# Patient Record
Sex: Female | Born: 1970
Health system: Southern US, Community
[De-identification: ages and names within clinical notes are randomized; demographics above are authoritative.]

## PROBLEM LIST (undated history)

## (undated) DIAGNOSIS — F988 Other specified behavioral and emotional disorders with onset usually occurring in childhood and adolescence: Secondary | ICD-10-CM

## (undated) DIAGNOSIS — R4586 Emotional lability: Secondary | ICD-10-CM

## (undated) HISTORY — PX: BREAST ENHANCEMENT SURGERY: SHX7

## (undated) HISTORY — DX: Other specified behavioral and emotional disorders with onset usually occurring in childhood and adolescence: F98.8

## (undated) HISTORY — PX: TONSILLECTOMY: SUR1361

## (undated) HISTORY — PX: TUBAL LIGATION: SHX77

## (undated) HISTORY — DX: Emotional lability: R45.86

---

## 2009-08-30 ENCOUNTER — Ambulatory Visit (HOSPITAL_COMMUNITY): Admission: RE | Admit: 2009-08-30 | Discharge: 2009-08-30 | Payer: Self-pay | Admitting: Family Medicine

## 2012-02-12 ENCOUNTER — Other Ambulatory Visit (HOSPITAL_COMMUNITY): Payer: Self-pay | Admitting: Family Medicine

## 2012-02-12 DIAGNOSIS — Z0000 Encounter for general adult medical examination without abnormal findings: Secondary | ICD-10-CM

## 2012-02-12 DIAGNOSIS — Z1231 Encounter for screening mammogram for malignant neoplasm of breast: Secondary | ICD-10-CM

## 2012-05-06 ENCOUNTER — Ambulatory Visit (HOSPITAL_COMMUNITY): Admission: RE | Admit: 2012-05-06 | Payer: Self-pay | Source: Ambulatory Visit

## 2012-07-12 ENCOUNTER — Ambulatory Visit (HOSPITAL_COMMUNITY)
Admission: RE | Admit: 2012-07-12 | Discharge: 2012-07-12 | Disposition: A | Discharge status: Home / Self Care | Payer: 59 | Source: Ambulatory Visit | Attending: Family Medicine | Admitting: Family Medicine

## 2012-07-12 DIAGNOSIS — Z1231 Encounter for screening mammogram for malignant neoplasm of breast: Secondary | ICD-10-CM | POA: Insufficient documentation

## 2013-11-28 ENCOUNTER — Other Ambulatory Visit (HOSPITAL_COMMUNITY): Payer: Self-pay | Admitting: Family Medicine

## 2013-11-28 DIAGNOSIS — Z1231 Encounter for screening mammogram for malignant neoplasm of breast: Secondary | ICD-10-CM

## 2013-12-05 ENCOUNTER — Ambulatory Visit (HOSPITAL_COMMUNITY): Payer: 59 | Attending: Family Medicine

## 2014-12-27 ENCOUNTER — Other Ambulatory Visit: Payer: Self-pay | Admitting: Family Medicine

## 2014-12-27 DIAGNOSIS — Z1231 Encounter for screening mammogram for malignant neoplasm of breast: Secondary | ICD-10-CM

## 2014-12-28 ENCOUNTER — Ambulatory Visit (HOSPITAL_COMMUNITY)
Admission: RE | Admit: 2014-12-28 | Discharge: 2014-12-28 | Disposition: A | Discharge status: Home / Self Care | Payer: 59 | Source: Ambulatory Visit | Attending: Family Medicine | Admitting: Family Medicine

## 2014-12-28 DIAGNOSIS — Z1231 Encounter for screening mammogram for malignant neoplasm of breast: Secondary | ICD-10-CM | POA: Diagnosis present

## 2015-05-16 DIAGNOSIS — H5213 Myopia, bilateral: Secondary | ICD-10-CM | POA: Diagnosis not present

## 2016-05-26 ENCOUNTER — Other Ambulatory Visit: Payer: Self-pay | Admitting: Family Medicine

## 2016-05-26 DIAGNOSIS — Z1231 Encounter for screening mammogram for malignant neoplasm of breast: Secondary | ICD-10-CM

## 2016-06-19 ENCOUNTER — Ambulatory Visit: Payer: 59

## 2016-07-03 ENCOUNTER — Ambulatory Visit
Admission: RE | Admit: 2016-07-03 | Discharge: 2016-07-03 | Disposition: A | Discharge status: Home / Self Care | Payer: 59 | Source: Ambulatory Visit | Attending: Family Medicine | Admitting: Family Medicine

## 2016-07-03 DIAGNOSIS — Z1231 Encounter for screening mammogram for malignant neoplasm of breast: Secondary | ICD-10-CM | POA: Diagnosis not present

## 2017-04-06 ENCOUNTER — Ambulatory Visit (INDEPENDENT_AMBULATORY_CARE_PROVIDER_SITE_OTHER): Payer: 59 | Admitting: General Practice

## 2017-04-06 DIAGNOSIS — N898 Other specified noninflammatory disorders of vagina: Secondary | ICD-10-CM | POA: Diagnosis not present

## 2017-04-06 DIAGNOSIS — N949 Unspecified condition associated with female genital organs and menstrual cycle: Secondary | ICD-10-CM

## 2017-04-06 DIAGNOSIS — Z113 Encounter for screening for infections with a predominantly sexual mode of transmission: Secondary | ICD-10-CM

## 2017-04-06 NOTE — Progress Notes (Signed)
Patient here today for self swab. Patient reports vaginal burning & thick discharge. Patient instructed in self swab & specimen collected. Informed patient we will contact her with abnormal results. Patient verbalized understanding & had no questions

## 2017-04-08 ENCOUNTER — Other Ambulatory Visit: Payer: Self-pay | Admitting: General Practice

## 2017-04-08 ENCOUNTER — Encounter: Payer: Self-pay | Admitting: General Practice

## 2017-04-08 DIAGNOSIS — B9689 Other specified bacterial agents as the cause of diseases classified elsewhere: Secondary | ICD-10-CM

## 2017-04-08 DIAGNOSIS — N760 Acute vaginitis: Principal | ICD-10-CM

## 2017-04-08 LAB — CERVICOVAGINAL ANCILLARY ONLY
Bacterial vaginitis: POSITIVE — AB
CHLAMYDIA, DNA PROBE: NEGATIVE
Candida vaginitis: NEGATIVE
Neisseria Gonorrhea: NEGATIVE
Trichomonas: NEGATIVE

## 2017-04-08 MED ORDER — METRONIDAZOLE 500 MG PO TABS: 500.0000 mg | ORAL_TABLET | Freq: Two times a day (BID) | ORAL | 0 refills | Status: DC

## 2017-04-09 ENCOUNTER — Other Ambulatory Visit: Payer: Self-pay | Admitting: Family Medicine

## 2017-09-10 ENCOUNTER — Other Ambulatory Visit: Payer: Self-pay | Admitting: Advanced Practice Midwife

## 2017-09-10 DIAGNOSIS — Z1231 Encounter for screening mammogram for malignant neoplasm of breast: Secondary | ICD-10-CM

## 2017-11-03 ENCOUNTER — Other Ambulatory Visit: Payer: Self-pay | Admitting: Student

## 2017-11-03 MED ORDER — CIPROFLOXACIN HCL 500 MG PO TABS
500.0000 mg | ORAL_TABLET | Freq: Two times a day (BID) | ORAL | 0 refills | Status: DC
Start: 2017-11-03 — End: 2019-06-14

## 2017-11-09 ENCOUNTER — Ambulatory Visit (INDEPENDENT_AMBULATORY_CARE_PROVIDER_SITE_OTHER): Payer: 59 | Admitting: Advanced Practice Midwife

## 2017-11-09 ENCOUNTER — Encounter: Payer: Self-pay | Admitting: Advanced Practice Midwife

## 2017-11-09 ENCOUNTER — Other Ambulatory Visit (HOSPITAL_COMMUNITY)
Admission: RE | Admit: 2017-11-09 | Discharge: 2017-11-09 | Disposition: A | Discharge status: Home / Self Care | Payer: 59 | Source: Ambulatory Visit | Attending: Advanced Practice Midwife | Admitting: Advanced Practice Midwife

## 2017-11-09 VITALS — BP 111/77 | HR 72 | Resp 16 | Ht 64.0 in | Wt 136.0 lb

## 2017-11-09 DIAGNOSIS — Z01419 Encounter for gynecological examination (general) (routine) without abnormal findings: Secondary | ICD-10-CM | POA: Insufficient documentation

## 2017-11-09 DIAGNOSIS — F419 Anxiety disorder, unspecified: Secondary | ICD-10-CM

## 2017-11-09 DIAGNOSIS — N3000 Acute cystitis without hematuria: Secondary | ICD-10-CM

## 2017-11-09 DIAGNOSIS — N898 Other specified noninflammatory disorders of vagina: Secondary | ICD-10-CM

## 2017-11-09 MED ORDER — METRONIDAZOLE 500 MG PO TABS: 500.0000 mg | ORAL_TABLET | Freq: Two times a day (BID) | ORAL | 2 refills | Status: AC

## 2017-11-09 MED ORDER — BUPROPION HCL ER (XL) 300 MG PO TB24: 300.0000 mg | ORAL_TABLET | Freq: Every day | ORAL | 11 refills | Status: DC

## 2017-11-09 MED ORDER — SULFAMETHOXAZOLE-TRIMETHOPRIM 800-160 MG PO TABS: 1.0000 | ORAL_TABLET | Freq: Two times a day (BID) | ORAL | 0 refills | Status: AC

## 2017-11-09 NOTE — Progress Notes (Signed)
Subjective:     Karen Cohen is a 47 y.o. female here for a routine exam.  Current complaints: vaginal discharge with odor and urine with odor.  She denies any abdominal or pelvic pain. She was treated for UTI 1 week ago with Cipro x 3 days but symptoms did not improve.  She is sexually active and is postmenopausal with LMP 2+ years ago.   She reports she was on Wellbutrin in the past and it worked well for her. She has not taken it for a long time but is not sleeping well and would like to try it again. She reports she is doing well, has support at home, and denies any thoughts of self harm.  Pt has primary care provider and participates in routine preventive care.  She has family hx of breast ca.  She scheduled her mammogram for this week, records to be sent to our office.  Pt had breast augmentation surgery since her last mammogram, 03/2017.     Gynecologic History No LMP recorded. (Menstrual status: Perimenopausal). Contraception: none Last Pap: unsure but within 5 years. Results were: normal Last mammogram: 2018. Results were: normal  Obstetric History OB History  Gravida Para Term Preterm AB Living  5 3 1 2 2     SAB TAB Ectopic Multiple Live Births  2            # Outcome Date GA Lbr Len/2nd Weight Sex Delivery Anes PTL Lv  5 SAB           4 SAB           3 Term      Vag-Spont     2 Preterm      Vag-Spont     1 Preterm      Vag-Spont        The following portions of the patient's history were reviewed and updated as appropriate: allergies, current medications, past family history, past medical history, past social history, past surgical history and problem list.  Review of Systems Pertinent items noted in HPI and remainder of comprehensive ROS otherwise negative.    Objective:   BP 111/77   Pulse 72   Resp 16   Ht 5\' 4"  (1.626 m)   Wt 136 lb (61.7 kg)   BMI 23.34 kg/m    VS reviewed, nursing note reviewed,  Constitutional: well developed, well nourished, no  distress HEENT: normocephalic CV: normal rate HEART: normal rate, heart sounds, regular rhythm RESP: normal effort, lung sounds clear and equal bilaterally Breast Exam:  right breast normal without mass, skin or nipple changes or axillary nodes, left breast normal without mass, skin or nipple changes or axillary nodes Abdomen: soft Neuro: alert and oriented x 3 Skin: warm, dry Psych: affect normal Pelvic exam: Cervix pink, visually closed, without lesion, small amount thin white discharge, vaginal walls and external genitalia normal Bimanual exam: Cervix 0/long/high, firm, anterior, neg CMT, uterus nontender, nonenlarged, adnexa without tenderness, enlargement, or mass  Assessment/Plan:   1. Well woman exam  - Culture, OB Urine - Cytology - PAP  2. Vaginal discharge --Clinical evidence and pt symptoms similar to past BV so will start treatment today. - metroNIDAZOLE (FLAGYL) 500 MG tablet; Take 1 tablet (500 mg total) by mouth 2 (two) times daily for 7 days.  Dispense: 14 tablet; Refill: 2  3. Acute cystitis without hematuria --Pt with failed treatment with Cipro --Urine culture pending but will change therapy to Bactrim - sulfamethoxazole-trimethoprim (BACTRIM DS,SEPTRA DS)  800-160 MG tablet; Take 1 tablet by mouth 2 (two) times daily for 3 days.  Dispense: 6 tablet; Refill: 0  4. Anxiety --Mild anxiety, well managed in past with Wellbutrin.  Start with 150 daily, then increase to 300 daily in 6 days (1/2 tablets for 6 days). - buPROPion (WELLBUTRIN XL) 300 MG 24 hr tablet; Take 1 tablet (300 mg total) by mouth daily. Take 1/2 tablet x 6 days to start, then 300 daily  Dispense: 30 tablet; Refill: 11  Yaritsa Savarino Leftwich-Kirby, CNM 1:25 PM

## 2017-11-09 NOTE — Patient Instructions (Signed)

## 2017-11-11 LAB — CYTOLOGY - PAP
BACTERIAL VAGINITIS: POSITIVE — AB
CHLAMYDIA, DNA PROBE: NEGATIVE
Candida vaginitis: NEGATIVE
DIAGNOSIS: UNDETERMINED — AB
HPV: NOT DETECTED
NEISSERIA GONORRHEA: NEGATIVE
Trichomonas: NEGATIVE

## 2017-11-13 ENCOUNTER — Ambulatory Visit
Admission: RE | Admit: 2017-11-13 | Discharge: 2017-11-13 | Disposition: A | Discharge status: Home / Self Care | Payer: 59 | Source: Ambulatory Visit | Attending: Advanced Practice Midwife | Admitting: Advanced Practice Midwife

## 2017-11-13 DIAGNOSIS — Z1231 Encounter for screening mammogram for malignant neoplasm of breast: Secondary | ICD-10-CM

## 2017-11-13 LAB — CULTURE, OB URINE

## 2017-11-13 LAB — URINE CULTURE, OB REFLEX

## 2017-11-17 ENCOUNTER — Other Ambulatory Visit: Payer: Self-pay | Admitting: Student

## 2018-03-03 ENCOUNTER — Telehealth: Payer: Self-pay | Admitting: Advanced Practice Midwife

## 2018-03-03 NOTE — Telephone Encounter (Signed)
Pt reports problems sleeping recently. Feels like mind is racing, too much to think about so can't go to sleep.  Has tried melatonin 3 mg that doesn't help, Benadryl 25 mg, which helps but makes her groggy the next day, and Ambien but it also makes her too groggy the next day.  Discussed options of increasing pt Wellbutrin (she is on 300 mg XL daily), trying smaller doses of Benadryl by cutting tablets or using liquid, or trying higher doses of melatonin.  Pt would like to try lower dose of Benadryl and/or increased melatonin for now and leave Wellbutrin at 300 mg daily. Discussed sleep hygiene prior to bedtime, including no screen time, relaxation exercises.  Pt to notify provider if sleep continues to be a problem.

## 2018-03-12 ENCOUNTER — Telehealth: Payer: 59 | Admitting: Family

## 2018-03-12 DIAGNOSIS — H5789 Other specified disorders of eye and adnexa: Secondary | ICD-10-CM | POA: Diagnosis not present

## 2018-03-12 NOTE — Progress Notes (Signed)
Thank you for the details you included in the comment boxes. Those details are very helpful in determining the best course of treatment for you and help [place] to provide the best care.Without specific drainage, this is more likely an allergic irritation and can improve with the treatment below, but would not require antibiotics. See plan below.  We are sorry that you are not feeling well.  Here is how we plan to help!  Based on what you have shared with me it looks like you have conjunctivitis.  Conjunctivitis is a common inflammatory or infectious condition of the eye that is often referred to as "pink eye".  In most cases it is contagious (viral or bacterial). However, not all conjunctivitis requires antibiotics (ex. Allergic).  We have made appropriate suggestions for you based upon your presentation.  I recommend that you use OpconA, 1-2 drops every 4-6 hours (an over the counter allergy drop available at your local pharmacy).  Your pharmacist may have an alternative suggestion.  Pink eye can be highly contagious.  It is typically spread through direct contact with secretions, or contaminated objects or surfaces that one may have touched.  Strict handwashing is suggested with soap and water is urged.  If not available, use alcohol based had sanitizer.  Avoid unnecessary touching of the eye.  If you wear contact lenses, you will need to refrain from wearing them until you see no white discharge from the eye for at least 24 hours after being on medication.  You should see symptom improvement in 1-2 days after starting the medication regimen.  Call us if symptoms are not improved in 1-2 days.  Home Care:  Wash your hands often!  Do not wear your contacts until you complete your treatment plan.  Avoid sharing towels, bed linen, personal items with a person who has pink eye.  See attention for anyone in your home with similar symptoms.  Get Help Right Away If:  Your symptoms do not improve.  You  develop blurred or loss of vision.  Your symptoms worsen (increased discharge, pain or redness)  Your e-visit answers were reviewed by a board certified advanced clinical practitioner to complete your personal care plan.  Depending on the condition, your plan could have included both over the counter or prescription medications.  If there is a problem please reply  once you have received a response from your provider.  Your safety is important to [place].  If you have drug allergies check your prescription carefully.    You can use MyChart to ask questions about today's visit, request a non-urgent call back, or ask for a work or school excuse for 24 hours related to this e-Visit. If it has been greater than 24 hours you will need to follow up with your provider, or enter a new e-Visit to address those concerns.   You will get an e-mail in the next two days asking about your experience.  I hope that your e-visit has been valuable and will speed your recovery. Thank you for using e-visits.

## 2018-04-20 DIAGNOSIS — N390 Urinary tract infection, site not specified: Secondary | ICD-10-CM | POA: Diagnosis not present

## 2018-04-20 DIAGNOSIS — Z113 Encounter for screening for infections with a predominantly sexual mode of transmission: Secondary | ICD-10-CM | POA: Diagnosis not present

## 2018-05-06 ENCOUNTER — Other Ambulatory Visit: Payer: Self-pay | Admitting: Advanced Practice Midwife

## 2018-05-06 DIAGNOSIS — F419 Anxiety disorder, unspecified: Secondary | ICD-10-CM

## 2018-05-06 MED ORDER — BUPROPION HCL ER (XL) 300 MG PO TB24
300.0000 mg | ORAL_TABLET | Freq: Every day | ORAL | 11 refills | Status: DC
Start: 2018-05-06 — End: 2018-09-10

## 2018-05-06 MED FILL — buPROPion HCL ER (XL) 300 M: 300 | 30 days supply | Qty: 30 | Fill #0

## 2018-05-06 NOTE — Progress Notes (Signed)
New Rx for Wellbutrin sent to [facility] due to pt insurance.

## 2018-06-16 MED FILL — buPROPion HCL ER (XL) 300 M: 300 | 30 days supply | Qty: 30 | Fill #1

## 2018-07-20 MED FILL — buPROPion HCL ER (XL) 300 M: 300 | 30 days supply | Qty: 30 | Fill #2

## 2018-09-10 ENCOUNTER — Telehealth: Payer: Self-pay | Admitting: Advanced Practice Midwife

## 2018-09-10 DIAGNOSIS — F419 Anxiety disorder, unspecified: Secondary | ICD-10-CM

## 2018-09-10 MED ORDER — BUPROPION HCL ER (XL) 300 MG PO TB24
300.0000 mg | ORAL_TABLET | Freq: Every day | ORAL | 4 refills | Status: DC
Start: 2018-09-10 — End: 2019-02-04

## 2018-09-10 MED FILL — buPROPion HCL ER (XL) 300 M: 300 | 90 days supply | Qty: 90 | Fill #0

## 2018-09-10 NOTE — Telephone Encounter (Signed)
Pt called to request Wellbutrin Rx in 90 day supply for mail order since pharmacy is closed due to COVID 19.  Rx written for 90 tablets with 4 refills.

## 2019-02-04 ENCOUNTER — Other Ambulatory Visit: Payer: Self-pay | Admitting: Advanced Practice Midwife

## 2019-02-04 DIAGNOSIS — F419 Anxiety disorder, unspecified: Secondary | ICD-10-CM

## 2019-02-04 MED ORDER — BUPROPION HCL ER (XL) 300 MG PO TB24: 300.0000 mg | ORAL_TABLET | Freq: Every day | ORAL | 4 refills | Status: DC

## 2019-02-04 NOTE — Progress Notes (Signed)
Refill for pt Wellbutrin 300 mg daily sent at pt request.

## 2019-02-08 DIAGNOSIS — Z0000 Encounter for general adult medical examination without abnormal findings: Secondary | ICD-10-CM | POA: Diagnosis not present

## 2019-02-08 DIAGNOSIS — F900 Attention-deficit hyperactivity disorder, predominantly inattentive type: Secondary | ICD-10-CM | POA: Diagnosis not present

## 2019-02-08 MED FILL — ADDERALL XR 20 MG CAP SA: 20 | 30 days supply | Qty: 30 | Fill #0

## 2019-02-09 ENCOUNTER — Other Ambulatory Visit: Payer: Self-pay | Admitting: Advanced Practice Midwife

## 2019-02-09 DIAGNOSIS — F419 Anxiety disorder, unspecified: Secondary | ICD-10-CM

## 2019-02-09 MED ORDER — BUPROPION HCL ER (XL) 300 MG PO TB24
300.0000 mg | ORAL_TABLET | Freq: Every day | ORAL | 4 refills | Status: AC
Start: 2019-02-09 — End: ?

## 2019-02-09 MED FILL — buPROPion HCL ER (XL) 300 M: 300 | 90 days supply | Qty: 90 | Fill #0

## 2019-02-09 NOTE — Progress Notes (Signed)
Rx for Wellbutrin changed from [facility] to [facility] at pt request.

## 2019-03-16 MED FILL — ADDERALL XR 20 MG CAP SA: 20 | 30 days supply | Qty: 30 | Fill #0

## 2019-05-11 DIAGNOSIS — F900 Attention-deficit hyperactivity disorder, predominantly inattentive type: Secondary | ICD-10-CM | POA: Diagnosis not present

## 2019-05-11 DIAGNOSIS — F419 Anxiety disorder, unspecified: Secondary | ICD-10-CM | POA: Diagnosis not present

## 2019-05-11 DIAGNOSIS — Z209 Contact with and (suspected) exposure to unspecified communicable disease: Secondary | ICD-10-CM | POA: Diagnosis not present

## 2019-05-23 MED FILL — BUPROPION HCL ER (XL) 300 M: 300 | 90 days supply | Qty: 90 | Fill #1

## 2019-06-14 ENCOUNTER — Other Ambulatory Visit: Payer: Self-pay

## 2019-06-14 ENCOUNTER — Encounter: Payer: Self-pay | Admitting: Advanced Practice Midwife

## 2019-06-14 ENCOUNTER — Ambulatory Visit (INDEPENDENT_AMBULATORY_CARE_PROVIDER_SITE_OTHER): Payer: 59 | Admitting: Advanced Practice Midwife

## 2019-06-14 VITALS — BP 120/76 | HR 73 | Temp 98.3°F | Resp 16 | Ht 64.0 in | Wt 132.0 lb

## 2019-06-14 DIAGNOSIS — Z1151 Encounter for screening for human papillomavirus (HPV): Secondary | ICD-10-CM | POA: Diagnosis not present

## 2019-06-14 DIAGNOSIS — Z01419 Encounter for gynecological examination (general) (routine) without abnormal findings: Secondary | ICD-10-CM

## 2019-06-14 DIAGNOSIS — F419 Anxiety disorder, unspecified: Secondary | ICD-10-CM | POA: Diagnosis not present

## 2019-06-14 DIAGNOSIS — Z124 Encounter for screening for malignant neoplasm of cervix: Secondary | ICD-10-CM

## 2019-06-14 DIAGNOSIS — Z634 Disappearance and death of family member: Secondary | ICD-10-CM | POA: Diagnosis not present

## 2019-06-14 NOTE — Progress Notes (Signed)
Subjective:     Karen Cohen is a 49 y.o. female here at [facility] for a routine exam.  Current complaints: no gyn concerns or complaints, LMP 6 years ago at age 38. Is in a monogamous relationship. Recently lost her father to COVID/pneumonia and is dealing with taking care of his house and things.  She reports good family support during a difficult time.     Gynecologic History No LMP recorded. (Menstrual status: Perimenopausal). Contraception: none Last Pap:11/09/2017. Results were: abnormal ASCUS with negative hpv Last mammogram: 11/13/2017. Results were: normal  Obstetric History OB History  Gravida Para Term Preterm AB Living  5 3 1 2 2     SAB TAB Ectopic Multiple Live Births  2            # Outcome Date GA Lbr Len/2nd Weight Sex Delivery Anes PTL Lv  5 SAB           4 SAB           3 Term      Vag-Spont     2 Preterm      Vag-Spont     1 Preterm      Vag-Spont        The following portions of the patient's history were reviewed and updated as appropriate: allergies, current medications, past family history, past medical history, past social history, past surgical history and problem list.  Review of Systems Pertinent items noted in HPI and remainder of comprehensive ROS otherwise negative.    Objective:   BP 120/76   Pulse 73   Temp 98.3 F (36.8 C)   Resp 16   Ht 5\' 4"  (1.626 m)   Wt 132 lb (59.9 kg)   BMI 22.66 kg/m    VS reviewed, nursing note reviewed,  Constitutional: well developed, well nourished, no distress HEENT: normocephalic CV: normal rate Pulm/chest wall: normal effort Breast Exam: Deferred, shared decision making with pt and screening mammogram ordered Abdomen: soft Neuro: alert and oriented x 3 Skin: warm, dry Psych: affect normal Pelvic exam: Cervix pink, visually closed, without lesion, scant white creamy discharge, vaginal walls and external genitalia normal Bimanual exam: Cervix 0/long/high, firm, anterior, neg CMT, uterus  nontender, nonenlarged, adnexa without tenderness, enlargement, or mass     Assessment/Plan:   1. Well woman exam with routine gynecological exam  - Cytology - PAP( [place])  2. Anxiety --Stable on Wellbutrin  3. Expected bereavement due to life event --Pt lost her father this week.  She is doing well despite difficulty. She has good support.     Follow up in: 1 year or as needed.   , CNM 12:19 PM

## 2019-06-14 NOTE — Progress Notes (Signed)
Thin prep collected today 

## 2019-06-15 DIAGNOSIS — F419 Anxiety disorder, unspecified: Secondary | ICD-10-CM | POA: Insufficient documentation

## 2019-06-15 LAB — CYTOLOGY - PAP
Comment: NEGATIVE
Diagnosis: NEGATIVE
High risk HPV: POSITIVE — AB

## 2019-06-24 ENCOUNTER — Other Ambulatory Visit: Payer: Self-pay | Admitting: Advanced Practice Midwife

## 2019-06-24 DIAGNOSIS — Z1231 Encounter for screening mammogram for malignant neoplasm of breast: Secondary | ICD-10-CM

## 2019-06-29 ENCOUNTER — Encounter: Payer: Self-pay | Admitting: Advanced Practice Midwife

## 2019-06-29 DIAGNOSIS — R87810 Cervical high risk human papillomavirus (HPV) DNA test positive: Secondary | ICD-10-CM | POA: Insufficient documentation

## 2019-07-01 ENCOUNTER — Ambulatory Visit
Admission: RE | Admit: 2019-07-01 | Discharge: 2019-07-01 | Disposition: A | Discharge status: Home / Self Care | Payer: 59 | Source: Ambulatory Visit

## 2019-07-01 ENCOUNTER — Other Ambulatory Visit: Payer: Self-pay

## 2019-07-01 DIAGNOSIS — Z1231 Encounter for screening mammogram for malignant neoplasm of breast: Secondary | ICD-10-CM

## 2019-08-09 DIAGNOSIS — F900 Attention-deficit hyperactivity disorder, predominantly inattentive type: Secondary | ICD-10-CM | POA: Diagnosis not present

## 2019-08-09 DIAGNOSIS — F419 Anxiety disorder, unspecified: Secondary | ICD-10-CM | POA: Diagnosis not present

## 2019-08-09 DIAGNOSIS — F4320 Adjustment disorder, unspecified: Secondary | ICD-10-CM | POA: Diagnosis not present

## 2019-09-30 ENCOUNTER — Other Ambulatory Visit: Payer: Self-pay | Admitting: Advanced Practice Midwife

## 2019-09-30 ENCOUNTER — Telehealth: Payer: Self-pay | Admitting: Advanced Practice Midwife

## 2019-09-30 DIAGNOSIS — L03113 Cellulitis of right upper limb: Secondary | ICD-10-CM

## 2019-09-30 MED ORDER — SULFAMETHOXAZOLE-TRIMETHOPRIM 400-80 MG PO TABS: 1.0000 | ORAL_TABLET | Freq: Two times a day (BID) | ORAL | 0 refills | Status: DC

## 2019-09-30 MED ORDER — FLUCONAZOLE 150 MG PO TABS: 150.0000 mg | ORAL_TABLET | Freq: Once | ORAL | 1 refills | Status: AC

## 2019-09-30 MED ORDER — SULFAMETHOXAZOLE-TRIMETHOPRIM 400-80 MG PO TABS: 1.0000 | ORAL_TABLET | Freq: Two times a day (BID) | ORAL | 0 refills | Status: AC

## 2019-09-30 NOTE — Telephone Encounter (Signed)
Pt called to report infected tattoo with pain and redness at site. She got a new tattoo on 09/23/19 and 3 days later began having pain and redness. The pain and redness is persistent but not worsening, and there is no drainage and no fever/chills or other systemic symptoms.    Rx for Bactrim BID x 10 days sent to pharmacy, [facility] in [place], [place]. Diflucan 150 mg x 1 with 1 refill sent at pt request.    Pt to follow up with PCP and/or dermatology if symptoms not improved in 48 hours and to seek emergency care if any systemic symptoms develop.

## 2019-10-21 ENCOUNTER — Other Ambulatory Visit: Payer: Self-pay | Admitting: Advanced Practice Midwife

## 2019-10-21 MED ORDER — AMPHETAMINE-DEXTROAMPHET ER 20 MG PO CP24: 20.0000 mg | ORAL_CAPSULE | Freq: Every day | ORAL | 0 refills | Status: AC

## 2019-11-08 DIAGNOSIS — F419 Anxiety disorder, unspecified: Secondary | ICD-10-CM | POA: Diagnosis not present

## 2019-11-08 DIAGNOSIS — F900 Attention-deficit hyperactivity disorder, predominantly inattentive type: Secondary | ICD-10-CM | POA: Diagnosis not present

## 2019-11-08 DIAGNOSIS — B002 Herpesviral gingivostomatitis and pharyngotonsillitis: Secondary | ICD-10-CM | POA: Diagnosis not present

## 2020-02-08 DIAGNOSIS — Z23 Encounter for immunization: Secondary | ICD-10-CM | POA: Diagnosis not present

## 2020-02-08 DIAGNOSIS — F419 Anxiety disorder, unspecified: Secondary | ICD-10-CM | POA: Diagnosis not present

## 2020-02-08 DIAGNOSIS — F900 Attention-deficit hyperactivity disorder, predominantly inattentive type: Secondary | ICD-10-CM | POA: Diagnosis not present

## 2020-07-31 ENCOUNTER — Other Ambulatory Visit: Payer: Self-pay | Admitting: Advanced Practice Midwife

## 2020-07-31 DIAGNOSIS — Z1231 Encounter for screening mammogram for malignant neoplasm of breast: Secondary | ICD-10-CM

## 2020-08-02 ENCOUNTER — Other Ambulatory Visit: Payer: Self-pay

## 2020-08-02 ENCOUNTER — Ambulatory Visit
Admission: RE | Admit: 2020-08-02 | Discharge: 2020-08-02 | Disposition: A | Discharge status: Home / Self Care | Payer: 59 | Source: Ambulatory Visit

## 2020-08-02 DIAGNOSIS — Z1231 Encounter for screening mammogram for malignant neoplasm of breast: Secondary | ICD-10-CM

## 2020-08-21 ENCOUNTER — Other Ambulatory Visit (HOSPITAL_COMMUNITY)
Admission: RE | Admit: 2020-08-21 | Discharge: 2020-08-21 | Disposition: A | Discharge status: Home / Self Care | Payer: 59 | Source: Ambulatory Visit | Attending: Certified Nurse Midwife | Admitting: Certified Nurse Midwife

## 2020-08-21 ENCOUNTER — Ambulatory Visit (INDEPENDENT_AMBULATORY_CARE_PROVIDER_SITE_OTHER): Payer: 59 | Admitting: Certified Nurse Midwife

## 2020-08-21 ENCOUNTER — Encounter: Payer: Self-pay | Admitting: Certified Nurse Midwife

## 2020-08-21 ENCOUNTER — Other Ambulatory Visit: Payer: Self-pay

## 2020-08-21 VITALS — BP 120/80 | HR 86 | Resp 16 | Ht 64.0 in | Wt 128.0 lb

## 2020-08-21 DIAGNOSIS — Z01419 Encounter for gynecological examination (general) (routine) without abnormal findings: Secondary | ICD-10-CM | POA: Insufficient documentation

## 2020-08-21 NOTE — Progress Notes (Signed)
Gynecology Annual Exam   History of Present Illness: Karen Cohen is a 50 y.o. single female presenting for an annual exam. She has no complaints today. She is not sexually active. She does perform self breast exams. There is notable family history of breast or ovarian cancer in her family.   Past Medical History:  Past Medical History:  Diagnosis Date  . ADD (attention deficit disorder)   . Mood swings     Past Surgical History:  Past Surgical History:  Procedure Laterality Date  . BREAST ENHANCEMENT SURGERY    . TONSILLECTOMY    . TUBAL LIGATION      Gynecologic History:  LMP: No LMP recorded. (Menstrual status: Perimenopausal). Contraception: tubal ligation Last Pap: completed on 11/2019; result was: NIL and HR HPV+  Mammogram: last completed on 08/02/20, result was normal.  Obstetric History: [id number]  Family History:  Family History  Problem Relation Age of Onset  . Breast cancer Mother 53    Social History:  Social History   Socioeconomic History  . Marital status: Single    Spouse name: Not on file  . Number of children: Not on file  . Years of education: Not on file  . Highest education level: Not on file  Occupational History  . Occupation: Charity fundraiser  Tobacco Use  . Smoking status: Never Smoker  . Smokeless tobacco: Never Used  Vaping Use  . Vaping Use: Never used  Substance and Sexual Activity  . Alcohol use: Yes    Comment: socially  . Drug use: Never  . Sexual activity: Yes    Partners: Male    Birth control/protection: None  Other Topics Concern  . Not on file  Social History Narrative  . Not on file   Social Determinants of Health   Financial Resource Strain: Not on file  Food Insecurity: Not on file  Transportation Needs: Not on file  Physical Activity: Not on file  Stress: Not on file  Social Connections: Not on file  Intimate Partner Violence: Not on file    Allergies:  No Known Allergies  Medications: Prior to Admission  medications   Medication Sig Start Date End Date Taking? Authorizing Provider  amphetamine-dextroamphetamine (ADDERALL XR) 20 MG 24 hr capsule Take 1 capsule (20 mg total) by mouth daily. 10/21/19  Yes Leftwich-Kirby, Wilmer Floor, CNM  buPROPion (WELLBUTRIN XL) 300 MG 24 hr tablet Take 1 tablet (300 mg total) by mouth daily. 02/09/19  Yes Leftwich-Kirby, Wilmer Floor, CNM    Review of Systems: negative except noted in HPI  Physical Exam Vitals: BP 120/80   Pulse 86   Resp 16   Ht 5\' 4"  (1.626 m)   Wt 128 lb (58.1 kg)   BMI 21.97 kg/m  General: NAD HEENT: normocephalic, atraumatic Thyroid: no enlargement, no palpable nodules Pulmonary: Normal rate and effort, CTAB Cardiovascular: RRR Breast: Breast symmetrical, no tenderness, no palpable nodules or masses, no skin or nipple retraction present, no nipple discharge. No axillary or supraclavicular lymphadenopathy. Abdomen: soft, non-tender, non-distended. No hepatomegaly, splenomegaly or masses palpable. No evidence of hernia  Genitourinary:  External: Normal external female genitalia. Normal urethral meatus  Vagina: Normal vaginal mucosa, no evidence of prolapse   Cervix: Grossly normal in appearance, no bleeding   Extremities: no edema, erythema, or tenderness Neurologic: Grossly intact Psychiatric: mood appropriate, affect full  Female chaperone present for pelvic and breast portions of the physical exam  Assessment:  1. Well woman exam with routine gynecological exam  Plan: Repeat pap today Follow up with GYN in 1 year or prn Follow up with PCP as scheduled  Donette Larry, CNM 08/21/2020 11:59 AM

## 2020-08-24 LAB — CYTOLOGY - PAP
Comment: NEGATIVE
Diagnosis: NEGATIVE
High risk HPV: NEGATIVE

## 2021-09-27 IMAGING — MG DIGITAL SCREENING BREAST BILAT IMPLANT W/ TOMO W/ CAD
9 of 12 series · 9 of 28 positions shown · non-contrast
Comparison: Previous exam(s).

CLINICAL DATA: Screening.

EXAM:
DIGITAL SCREENING BILATERAL MAMMOGRAM WITH IMPLANTS, CAD AND
TOMOSYNTHESIS
TECHNIQUE: Bilateral screening digital craniocaudal and mediolateral oblique
mammograms were obtained. Bilateral screening digital breast
tomosynthesis was performed. The images were evaluated with
computer-aided detection. Standard and/or implant displaced views
were performed.

[R CC]
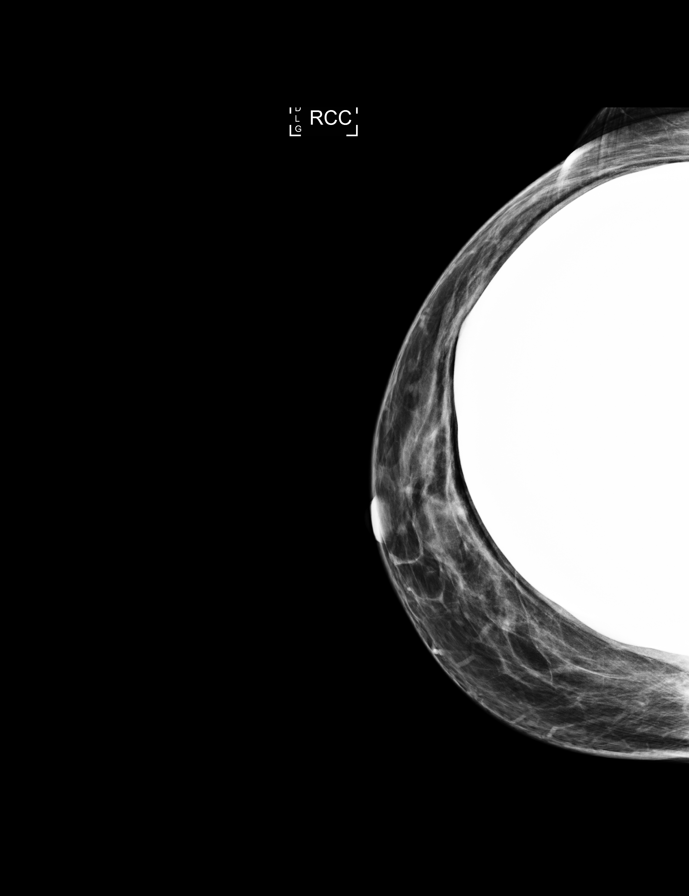

[L CC]
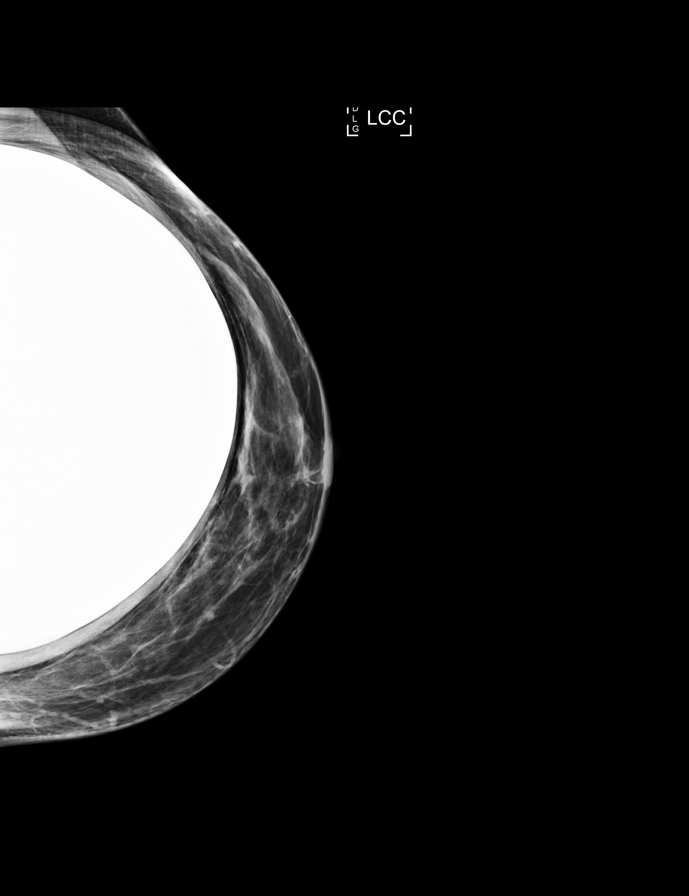

[L MLO]
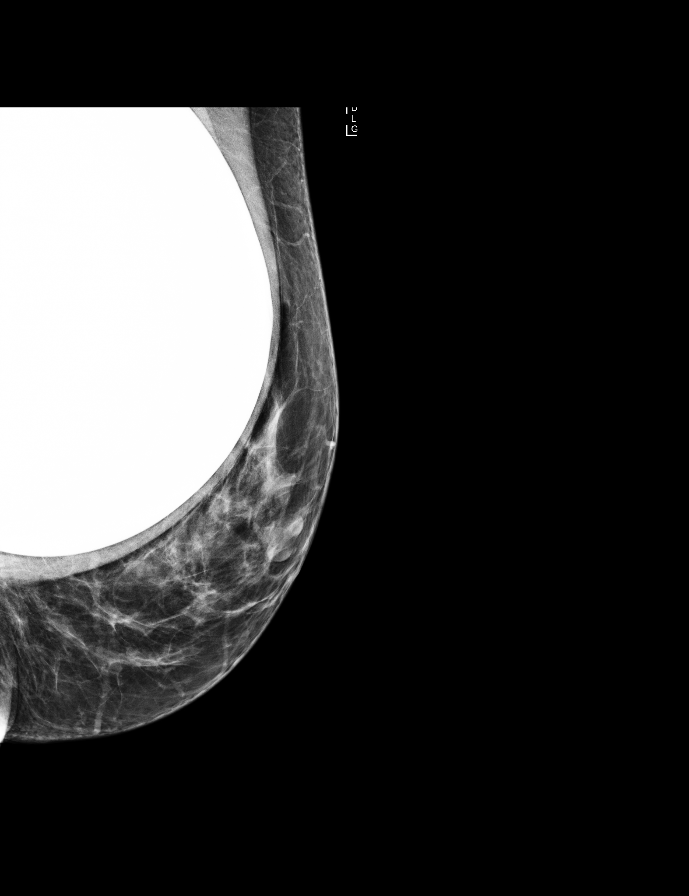

[R MLO]
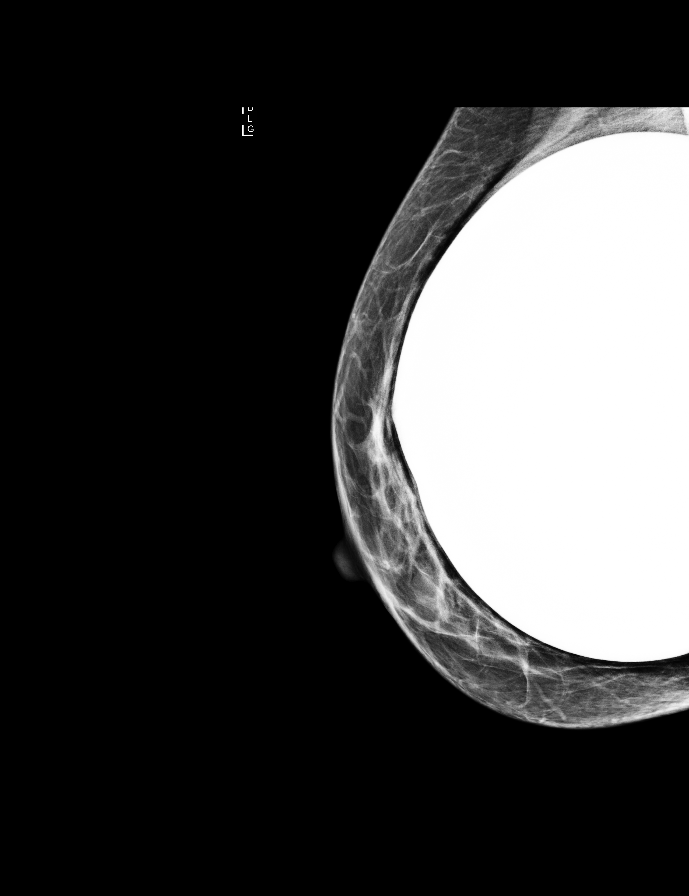

[L CC synth-2D]
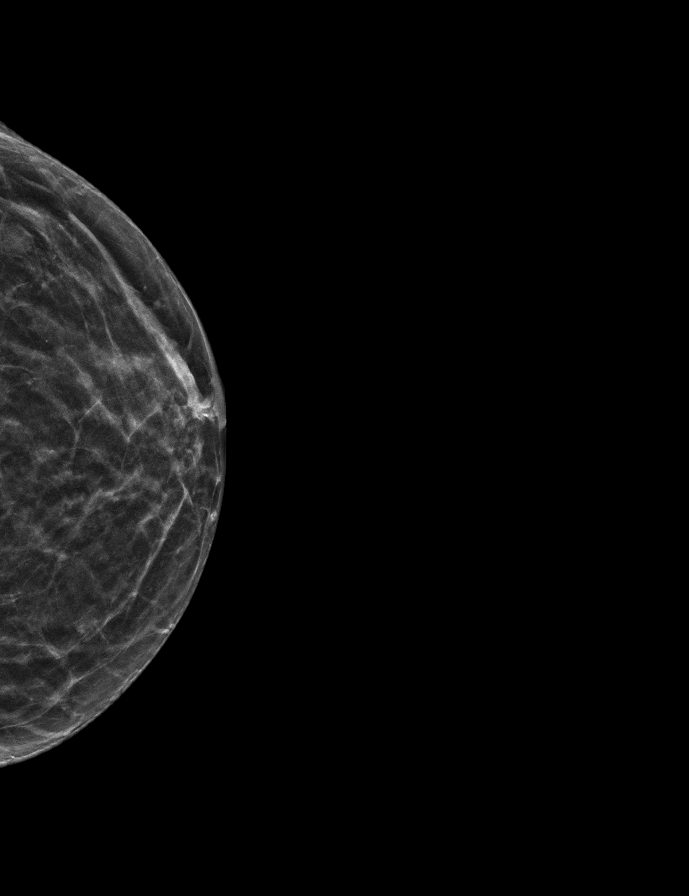

[R CC synth-2D]
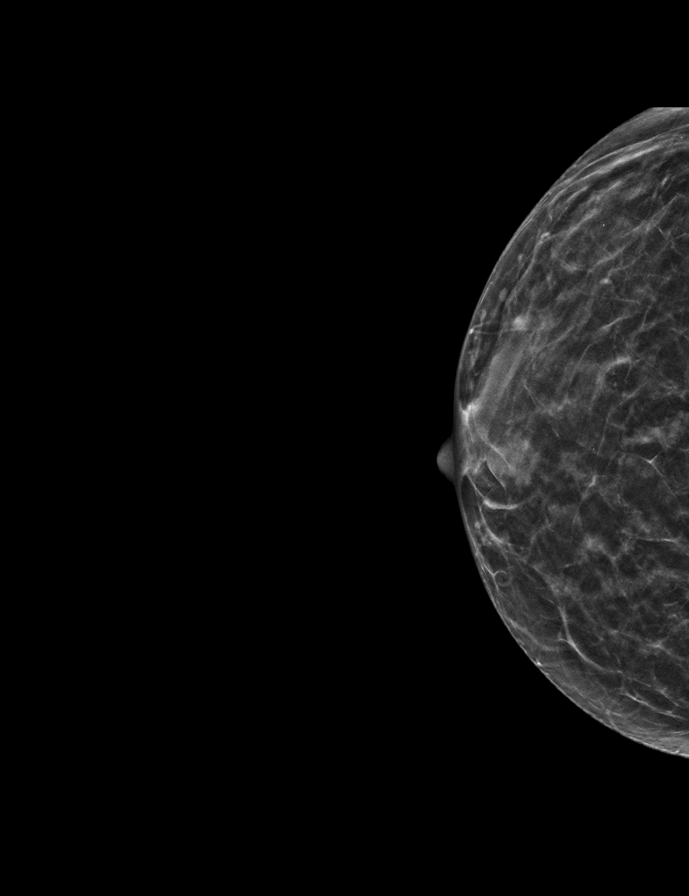

[L MLO synth-2D]
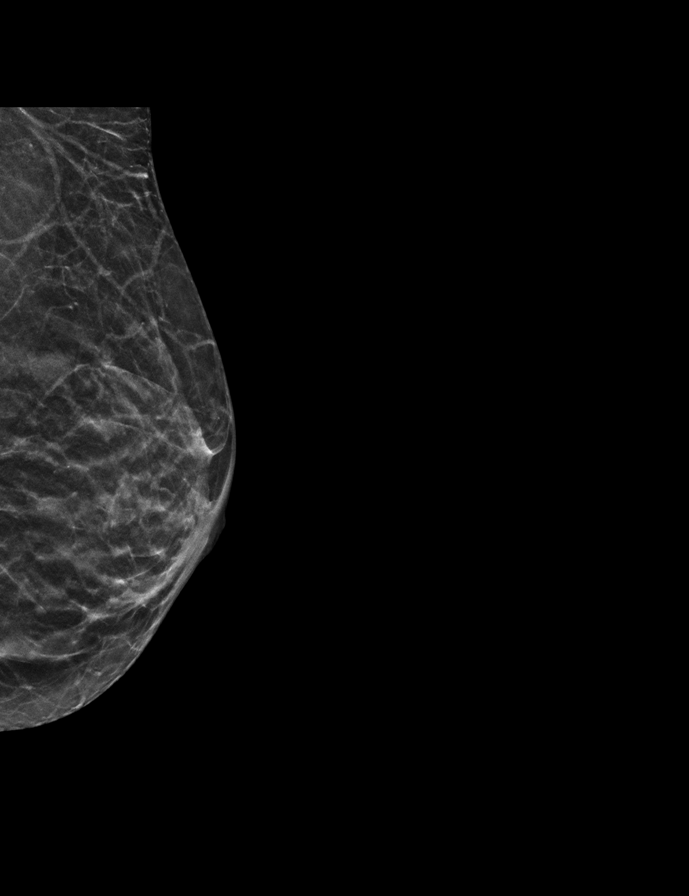

[R MLO synth-2D]
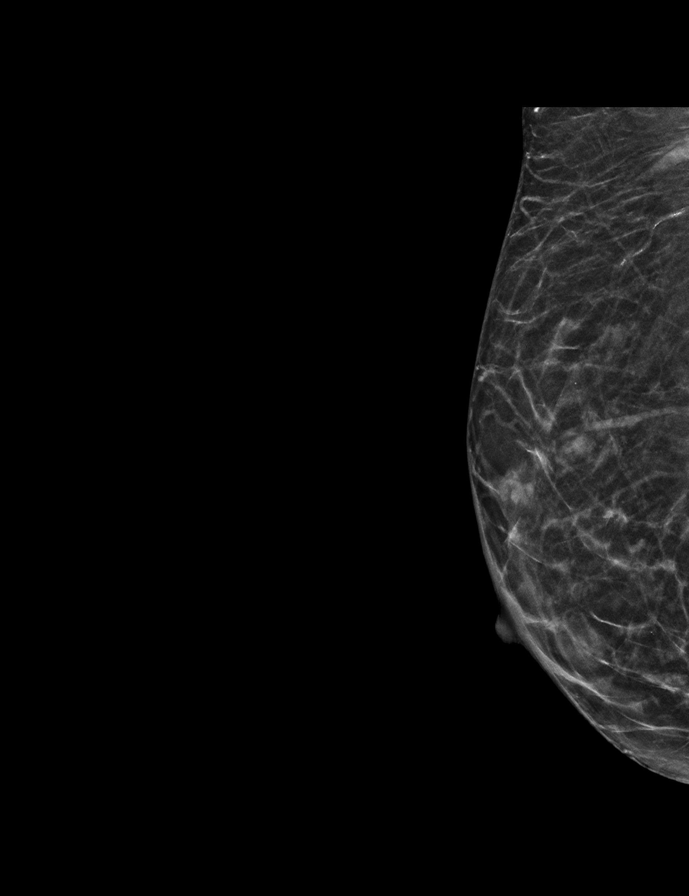

[L CCID BREAST TOMOSYNTHESIS IMAGE tomo · tomo slice 18/35.0]
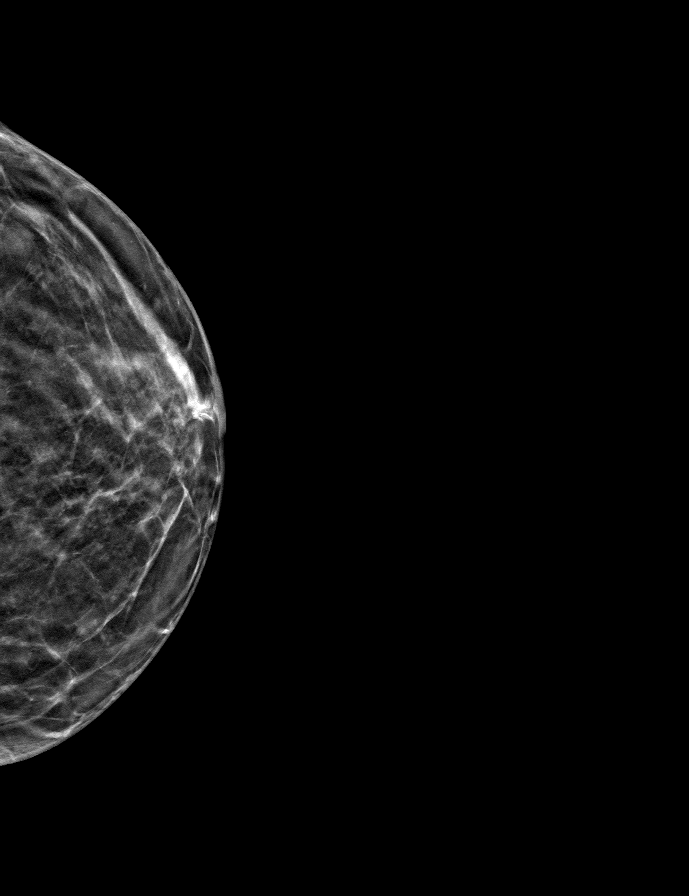

[9 of 28 positions shown; findings below may reference images not displayed]

ACR Breast Density Category b: There are scattered areas of
fibroglandular density.
FINDINGS: The patient has retropectoral implants. There are no findings
suspicious for malignancy.
IMPRESSION: No mammographic evidence of malignancy. A result letter of this
screening mammogram will be mailed directly to the patient.

RECOMMENDATION:
Screening mammogram in one year. (Code:SE-S-JMG)

BI-RADS CATEGORY  1:  Negative.

## 2022-03-14 ENCOUNTER — Other Ambulatory Visit: Payer: Self-pay | Admitting: Advanced Practice Midwife

## 2022-03-14 DIAGNOSIS — Z1231 Encounter for screening mammogram for malignant neoplasm of breast: Secondary | ICD-10-CM

## 2022-03-26 ENCOUNTER — Ambulatory Visit
Admission: RE | Admit: 2022-03-26 | Discharge: 2022-03-26 | Disposition: A | Discharge status: Home / Self Care | Payer: Commercial Managed Care - PPO | Source: Ambulatory Visit

## 2022-03-26 DIAGNOSIS — Z1231 Encounter for screening mammogram for malignant neoplasm of breast: Secondary | ICD-10-CM

## 2023-03-10 ENCOUNTER — Other Ambulatory Visit: Payer: Self-pay | Admitting: Physician Assistant

## 2023-03-10 DIAGNOSIS — Z1231 Encounter for screening mammogram for malignant neoplasm of breast: Secondary | ICD-10-CM

## 2023-04-06 ENCOUNTER — Other Ambulatory Visit: Payer: Self-pay | Admitting: Certified Nurse Midwife

## 2023-04-06 DIAGNOSIS — Z1231 Encounter for screening mammogram for malignant neoplasm of breast: Secondary | ICD-10-CM

## 2023-04-08 DIAGNOSIS — Z1231 Encounter for screening mammogram for malignant neoplasm of breast: Secondary | ICD-10-CM

## 2023-05-05 DIAGNOSIS — Z1231 Encounter for screening mammogram for malignant neoplasm of breast: Secondary | ICD-10-CM

## 2023-05-13 ENCOUNTER — Other Ambulatory Visit: Payer: Self-pay | Admitting: Certified Nurse Midwife

## 2023-05-13 DIAGNOSIS — Z1231 Encounter for screening mammogram for malignant neoplasm of breast: Secondary | ICD-10-CM

## 2023-06-03 ENCOUNTER — Ambulatory Visit
Admission: RE | Admit: 2023-06-03 | Discharge: 2023-06-03 | Disposition: A | Discharge status: Home / Self Care | Payer: BC Managed Care – PPO | Source: Ambulatory Visit

## 2023-06-03 DIAGNOSIS — Z1231 Encounter for screening mammogram for malignant neoplasm of breast: Secondary | ICD-10-CM

## 2024-06-21 ENCOUNTER — Ambulatory Visit
